# Patient Record
Sex: Male | Born: 1955 | Race: White | Hispanic: No | Marital: Married | Smoking: Former smoker
Health system: Southern US, Community
[De-identification: ages and names within clinical notes are randomized; demographics above are authoritative.]

## PROBLEM LIST (undated history)

## (undated) DIAGNOSIS — M1990 Unspecified osteoarthritis, unspecified site: Secondary | ICD-10-CM

## (undated) DIAGNOSIS — R931 Abnormal findings on diagnostic imaging of heart and coronary circulation: Secondary | ICD-10-CM

## (undated) DIAGNOSIS — E785 Hyperlipidemia, unspecified: Secondary | ICD-10-CM

## (undated) DIAGNOSIS — K5090 Crohn's disease, unspecified, without complications: Secondary | ICD-10-CM

## (undated) HISTORY — PX: TONSILLECTOMY: SUR1361

## (undated) HISTORY — DX: Unspecified osteoarthritis, unspecified site: M19.90

## (undated) HISTORY — DX: Abnormal findings on diagnostic imaging of heart and coronary circulation: R93.1

## (undated) HISTORY — DX: Crohn's disease, unspecified, without complications: K50.90

## (undated) HISTORY — DX: Hyperlipidemia, unspecified: E78.5

---

## 2015-06-20 DIAGNOSIS — L4050 Arthropathic psoriasis, unspecified: Secondary | ICD-10-CM | POA: Diagnosis not present

## 2015-10-03 DIAGNOSIS — T1512XA Foreign body in conjunctival sac, left eye, initial encounter: Secondary | ICD-10-CM | POA: Diagnosis not present

## 2015-10-15 DIAGNOSIS — Z0000 Encounter for general adult medical examination without abnormal findings: Secondary | ICD-10-CM | POA: Diagnosis not present

## 2015-10-15 DIAGNOSIS — Z125 Encounter for screening for malignant neoplasm of prostate: Secondary | ICD-10-CM | POA: Diagnosis not present

## 2015-10-22 DIAGNOSIS — K219 Gastro-esophageal reflux disease without esophagitis: Secondary | ICD-10-CM | POA: Diagnosis not present

## 2015-10-22 DIAGNOSIS — F17211 Nicotine dependence, cigarettes, in remission: Secondary | ICD-10-CM | POA: Diagnosis not present

## 2015-10-22 DIAGNOSIS — J309 Allergic rhinitis, unspecified: Secondary | ICD-10-CM | POA: Diagnosis not present

## 2015-10-22 DIAGNOSIS — Z0001 Encounter for general adult medical examination with abnormal findings: Secondary | ICD-10-CM | POA: Diagnosis not present

## 2015-10-22 DIAGNOSIS — E785 Hyperlipidemia, unspecified: Secondary | ICD-10-CM | POA: Diagnosis not present

## 2015-12-20 DIAGNOSIS — L4050 Arthropathic psoriasis, unspecified: Secondary | ICD-10-CM | POA: Diagnosis not present

## 2015-12-20 DIAGNOSIS — Z79899 Other long term (current) drug therapy: Secondary | ICD-10-CM | POA: Diagnosis not present

## 2016-04-14 DIAGNOSIS — E785 Hyperlipidemia, unspecified: Secondary | ICD-10-CM | POA: Diagnosis not present

## 2016-04-22 DIAGNOSIS — J309 Allergic rhinitis, unspecified: Secondary | ICD-10-CM | POA: Diagnosis not present

## 2016-04-22 DIAGNOSIS — F17211 Nicotine dependence, cigarettes, in remission: Secondary | ICD-10-CM | POA: Diagnosis not present

## 2016-04-22 DIAGNOSIS — E785 Hyperlipidemia, unspecified: Secondary | ICD-10-CM | POA: Diagnosis not present

## 2016-04-22 DIAGNOSIS — K219 Gastro-esophageal reflux disease without esophagitis: Secondary | ICD-10-CM | POA: Diagnosis not present

## 2016-06-19 DIAGNOSIS — Z79899 Other long term (current) drug therapy: Secondary | ICD-10-CM | POA: Diagnosis not present

## 2016-06-19 DIAGNOSIS — L4050 Arthropathic psoriasis, unspecified: Secondary | ICD-10-CM | POA: Diagnosis not present

## 2016-06-19 DIAGNOSIS — M189 Osteoarthritis of first carpometacarpal joint, unspecified: Secondary | ICD-10-CM | POA: Diagnosis not present

## 2016-10-23 DIAGNOSIS — Z79899 Other long term (current) drug therapy: Secondary | ICD-10-CM | POA: Diagnosis not present

## 2016-10-23 DIAGNOSIS — Z1321 Encounter for screening for nutritional disorder: Secondary | ICD-10-CM | POA: Diagnosis not present

## 2016-10-23 DIAGNOSIS — Z125 Encounter for screening for malignant neoplasm of prostate: Secondary | ICD-10-CM | POA: Diagnosis not present

## 2016-10-23 DIAGNOSIS — K219 Gastro-esophageal reflux disease without esophagitis: Secondary | ICD-10-CM | POA: Diagnosis not present

## 2016-10-23 DIAGNOSIS — E785 Hyperlipidemia, unspecified: Secondary | ICD-10-CM | POA: Diagnosis not present

## 2016-10-23 DIAGNOSIS — Z0001 Encounter for general adult medical examination with abnormal findings: Secondary | ICD-10-CM | POA: Diagnosis not present

## 2016-10-23 DIAGNOSIS — M7552 Bursitis of left shoulder: Secondary | ICD-10-CM | POA: Diagnosis not present

## 2016-10-23 DIAGNOSIS — L4050 Arthropathic psoriasis, unspecified: Secondary | ICD-10-CM | POA: Diagnosis not present

## 2016-10-30 DIAGNOSIS — J309 Allergic rhinitis, unspecified: Secondary | ICD-10-CM | POA: Diagnosis not present

## 2016-10-30 DIAGNOSIS — M19012 Primary osteoarthritis, left shoulder: Secondary | ICD-10-CM | POA: Diagnosis not present

## 2016-10-30 DIAGNOSIS — K219 Gastro-esophageal reflux disease without esophagitis: Secondary | ICD-10-CM | POA: Diagnosis not present

## 2016-10-30 DIAGNOSIS — M25512 Pain in left shoulder: Secondary | ICD-10-CM | POA: Diagnosis not present

## 2016-10-30 DIAGNOSIS — Z0000 Encounter for general adult medical examination without abnormal findings: Secondary | ICD-10-CM | POA: Diagnosis not present

## 2016-10-30 DIAGNOSIS — E785 Hyperlipidemia, unspecified: Secondary | ICD-10-CM | POA: Diagnosis not present

## 2017-04-30 DIAGNOSIS — M19042 Primary osteoarthritis, left hand: Secondary | ICD-10-CM | POA: Diagnosis not present

## 2017-04-30 DIAGNOSIS — M79642 Pain in left hand: Secondary | ICD-10-CM | POA: Diagnosis not present

## 2017-04-30 DIAGNOSIS — M19041 Primary osteoarthritis, right hand: Secondary | ICD-10-CM | POA: Diagnosis not present

## 2017-04-30 DIAGNOSIS — L4050 Arthropathic psoriasis, unspecified: Secondary | ICD-10-CM | POA: Diagnosis not present

## 2017-04-30 DIAGNOSIS — Z79899 Other long term (current) drug therapy: Secondary | ICD-10-CM | POA: Diagnosis not present

## 2017-10-26 DIAGNOSIS — Z125 Encounter for screening for malignant neoplasm of prostate: Secondary | ICD-10-CM | POA: Diagnosis not present

## 2017-10-26 DIAGNOSIS — Z0000 Encounter for general adult medical examination without abnormal findings: Secondary | ICD-10-CM | POA: Diagnosis not present

## 2017-11-05 DIAGNOSIS — L4050 Arthropathic psoriasis, unspecified: Secondary | ICD-10-CM | POA: Diagnosis not present

## 2017-11-05 DIAGNOSIS — K219 Gastro-esophageal reflux disease without esophagitis: Secondary | ICD-10-CM | POA: Diagnosis not present

## 2017-11-05 DIAGNOSIS — E785 Hyperlipidemia, unspecified: Secondary | ICD-10-CM | POA: Diagnosis not present

## 2017-11-05 DIAGNOSIS — Z0000 Encounter for general adult medical examination without abnormal findings: Secondary | ICD-10-CM | POA: Diagnosis not present

## 2017-12-21 DIAGNOSIS — K219 Gastro-esophageal reflux disease without esophagitis: Secondary | ICD-10-CM | POA: Diagnosis not present

## 2017-12-21 DIAGNOSIS — M1990 Unspecified osteoarthritis, unspecified site: Secondary | ICD-10-CM | POA: Diagnosis not present

## 2017-12-21 DIAGNOSIS — Z23 Encounter for immunization: Secondary | ICD-10-CM | POA: Diagnosis not present

## 2017-12-21 DIAGNOSIS — L4050 Arthropathic psoriasis, unspecified: Secondary | ICD-10-CM | POA: Diagnosis not present

## 2017-12-21 DIAGNOSIS — Z79899 Other long term (current) drug therapy: Secondary | ICD-10-CM | POA: Diagnosis not present

## 2018-09-06 DIAGNOSIS — K219 Gastro-esophageal reflux disease without esophagitis: Secondary | ICD-10-CM | POA: Diagnosis not present

## 2018-09-06 DIAGNOSIS — M1990 Unspecified osteoarthritis, unspecified site: Secondary | ICD-10-CM | POA: Diagnosis not present

## 2018-09-06 DIAGNOSIS — L4050 Arthropathic psoriasis, unspecified: Secondary | ICD-10-CM | POA: Diagnosis not present

## 2018-09-06 DIAGNOSIS — Z79899 Other long term (current) drug therapy: Secondary | ICD-10-CM | POA: Diagnosis not present

## 2018-10-18 DIAGNOSIS — E785 Hyperlipidemia, unspecified: Secondary | ICD-10-CM | POA: Diagnosis not present

## 2018-10-18 DIAGNOSIS — Z125 Encounter for screening for malignant neoplasm of prostate: Secondary | ICD-10-CM | POA: Diagnosis not present

## 2018-10-18 DIAGNOSIS — Z0000 Encounter for general adult medical examination without abnormal findings: Secondary | ICD-10-CM | POA: Diagnosis not present

## 2018-10-25 DIAGNOSIS — Z0000 Encounter for general adult medical examination without abnormal findings: Secondary | ICD-10-CM | POA: Diagnosis not present

## 2018-10-25 DIAGNOSIS — L4050 Arthropathic psoriasis, unspecified: Secondary | ICD-10-CM | POA: Diagnosis not present

## 2018-10-25 DIAGNOSIS — E782 Mixed hyperlipidemia: Secondary | ICD-10-CM | POA: Diagnosis not present

## 2018-10-25 DIAGNOSIS — K219 Gastro-esophageal reflux disease without esophagitis: Secondary | ICD-10-CM | POA: Diagnosis not present

## 2018-12-27 DIAGNOSIS — Z23 Encounter for immunization: Secondary | ICD-10-CM | POA: Diagnosis not present

## 2019-05-09 DIAGNOSIS — M1990 Unspecified osteoarthritis, unspecified site: Secondary | ICD-10-CM | POA: Diagnosis not present

## 2019-05-09 DIAGNOSIS — L4050 Arthropathic psoriasis, unspecified: Secondary | ICD-10-CM | POA: Diagnosis not present

## 2019-05-09 DIAGNOSIS — K219 Gastro-esophageal reflux disease without esophagitis: Secondary | ICD-10-CM | POA: Diagnosis not present

## 2019-05-09 DIAGNOSIS — Z79899 Other long term (current) drug therapy: Secondary | ICD-10-CM | POA: Diagnosis not present

## 2019-07-14 DIAGNOSIS — H40033 Anatomical narrow angle, bilateral: Secondary | ICD-10-CM | POA: Diagnosis not present

## 2019-07-19 DIAGNOSIS — H40033 Anatomical narrow angle, bilateral: Secondary | ICD-10-CM | POA: Diagnosis not present

## 2019-07-19 DIAGNOSIS — H40053 Ocular hypertension, bilateral: Secondary | ICD-10-CM | POA: Diagnosis not present

## 2019-07-19 DIAGNOSIS — H02834 Dermatochalasis of left upper eyelid: Secondary | ICD-10-CM | POA: Diagnosis not present

## 2019-07-19 DIAGNOSIS — H02831 Dermatochalasis of right upper eyelid: Secondary | ICD-10-CM | POA: Diagnosis not present

## 2019-08-29 DIAGNOSIS — H02834 Dermatochalasis of left upper eyelid: Secondary | ICD-10-CM | POA: Diagnosis not present

## 2019-08-29 DIAGNOSIS — H40033 Anatomical narrow angle, bilateral: Secondary | ICD-10-CM | POA: Diagnosis not present

## 2019-08-29 DIAGNOSIS — H40053 Ocular hypertension, bilateral: Secondary | ICD-10-CM | POA: Diagnosis not present

## 2019-08-29 DIAGNOSIS — H02831 Dermatochalasis of right upper eyelid: Secondary | ICD-10-CM | POA: Diagnosis not present

## 2019-10-31 DIAGNOSIS — Z125 Encounter for screening for malignant neoplasm of prostate: Secondary | ICD-10-CM | POA: Diagnosis not present

## 2019-10-31 DIAGNOSIS — Z0000 Encounter for general adult medical examination without abnormal findings: Secondary | ICD-10-CM | POA: Diagnosis not present

## 2019-10-31 DIAGNOSIS — E785 Hyperlipidemia, unspecified: Secondary | ICD-10-CM | POA: Diagnosis not present

## 2019-11-07 DIAGNOSIS — L4050 Arthropathic psoriasis, unspecified: Secondary | ICD-10-CM | POA: Diagnosis not present

## 2019-11-07 DIAGNOSIS — Z0000 Encounter for general adult medical examination without abnormal findings: Secondary | ICD-10-CM | POA: Diagnosis not present

## 2019-11-07 DIAGNOSIS — K219 Gastro-esophageal reflux disease without esophagitis: Secondary | ICD-10-CM | POA: Diagnosis not present

## 2019-11-07 DIAGNOSIS — J309 Allergic rhinitis, unspecified: Secondary | ICD-10-CM | POA: Diagnosis not present

## 2019-11-30 DIAGNOSIS — Z1211 Encounter for screening for malignant neoplasm of colon: Secondary | ICD-10-CM | POA: Diagnosis not present

## 2019-12-27 DIAGNOSIS — K6389 Other specified diseases of intestine: Secondary | ICD-10-CM | POA: Diagnosis not present

## 2019-12-27 DIAGNOSIS — Z1211 Encounter for screening for malignant neoplasm of colon: Secondary | ICD-10-CM | POA: Diagnosis not present

## 2019-12-27 DIAGNOSIS — K529 Noninfective gastroenteritis and colitis, unspecified: Secondary | ICD-10-CM | POA: Diagnosis not present

## 2020-01-24 DIAGNOSIS — M1990 Unspecified osteoarthritis, unspecified site: Secondary | ICD-10-CM | POA: Diagnosis not present

## 2020-01-24 DIAGNOSIS — K219 Gastro-esophageal reflux disease without esophagitis: Secondary | ICD-10-CM | POA: Diagnosis not present

## 2020-01-24 DIAGNOSIS — Z79899 Other long term (current) drug therapy: Secondary | ICD-10-CM | POA: Diagnosis not present

## 2020-01-24 DIAGNOSIS — Z23 Encounter for immunization: Secondary | ICD-10-CM | POA: Diagnosis not present

## 2020-01-24 DIAGNOSIS — L4050 Arthropathic psoriasis, unspecified: Secondary | ICD-10-CM | POA: Diagnosis not present

## 2020-02-29 DIAGNOSIS — K5090 Crohn's disease, unspecified, without complications: Secondary | ICD-10-CM | POA: Diagnosis not present

## 2020-02-29 DIAGNOSIS — R197 Diarrhea, unspecified: Secondary | ICD-10-CM | POA: Diagnosis not present

## 2020-06-20 DIAGNOSIS — K5090 Crohn's disease, unspecified, without complications: Secondary | ICD-10-CM | POA: Diagnosis not present

## 2020-06-20 DIAGNOSIS — R197 Diarrhea, unspecified: Secondary | ICD-10-CM | POA: Diagnosis not present

## 2020-07-18 DIAGNOSIS — R197 Diarrhea, unspecified: Secondary | ICD-10-CM | POA: Diagnosis not present

## 2020-07-18 DIAGNOSIS — K5090 Crohn's disease, unspecified, without complications: Secondary | ICD-10-CM | POA: Diagnosis not present

## 2020-07-23 DIAGNOSIS — L4050 Arthropathic psoriasis, unspecified: Secondary | ICD-10-CM | POA: Diagnosis not present

## 2020-07-23 DIAGNOSIS — M1812 Unilateral primary osteoarthritis of first carpometacarpal joint, left hand: Secondary | ICD-10-CM | POA: Diagnosis not present

## 2020-07-23 DIAGNOSIS — Z79899 Other long term (current) drug therapy: Secondary | ICD-10-CM | POA: Diagnosis not present

## 2020-07-23 DIAGNOSIS — M1811 Unilateral primary osteoarthritis of first carpometacarpal joint, right hand: Secondary | ICD-10-CM | POA: Diagnosis not present

## 2020-07-23 DIAGNOSIS — K219 Gastro-esophageal reflux disease without esophagitis: Secondary | ICD-10-CM | POA: Diagnosis not present

## 2020-07-23 DIAGNOSIS — M19042 Primary osteoarthritis, left hand: Secondary | ICD-10-CM | POA: Diagnosis not present

## 2020-07-23 DIAGNOSIS — M19041 Primary osteoarthritis, right hand: Secondary | ICD-10-CM | POA: Diagnosis not present

## 2020-07-23 DIAGNOSIS — M1990 Unspecified osteoarthritis, unspecified site: Secondary | ICD-10-CM | POA: Diagnosis not present

## 2020-07-23 DIAGNOSIS — M79641 Pain in right hand: Secondary | ICD-10-CM | POA: Diagnosis not present

## 2020-09-12 DIAGNOSIS — K5090 Crohn's disease, unspecified, without complications: Secondary | ICD-10-CM | POA: Diagnosis not present

## 2020-09-12 DIAGNOSIS — R197 Diarrhea, unspecified: Secondary | ICD-10-CM | POA: Diagnosis not present

## 2020-10-29 DIAGNOSIS — Z0000 Encounter for general adult medical examination without abnormal findings: Secondary | ICD-10-CM | POA: Diagnosis not present

## 2020-10-29 DIAGNOSIS — Z125 Encounter for screening for malignant neoplasm of prostate: Secondary | ICD-10-CM | POA: Diagnosis not present

## 2020-10-31 DIAGNOSIS — L4052 Psoriatic arthritis mutilans: Secondary | ICD-10-CM | POA: Diagnosis not present

## 2020-10-31 DIAGNOSIS — K5090 Crohn's disease, unspecified, without complications: Secondary | ICD-10-CM | POA: Diagnosis not present

## 2020-11-05 DIAGNOSIS — Z0000 Encounter for general adult medical examination without abnormal findings: Secondary | ICD-10-CM | POA: Diagnosis not present

## 2020-11-06 DIAGNOSIS — K6389 Other specified diseases of intestine: Secondary | ICD-10-CM | POA: Diagnosis not present

## 2020-11-06 DIAGNOSIS — K5090 Crohn's disease, unspecified, without complications: Secondary | ICD-10-CM | POA: Diagnosis not present

## 2020-11-06 DIAGNOSIS — R197 Diarrhea, unspecified: Secondary | ICD-10-CM | POA: Diagnosis not present

## 2020-11-06 DIAGNOSIS — K529 Noninfective gastroenteritis and colitis, unspecified: Secondary | ICD-10-CM | POA: Diagnosis not present

## 2020-11-07 ENCOUNTER — Other Ambulatory Visit: Payer: Self-pay | Admitting: Internal Medicine

## 2020-11-07 DIAGNOSIS — E782 Mixed hyperlipidemia: Secondary | ICD-10-CM

## 2020-12-03 ENCOUNTER — Other Ambulatory Visit: Payer: Self-pay

## 2020-12-14 ENCOUNTER — Ambulatory Visit
Admission: RE | Admit: 2020-12-14 | Discharge: 2020-12-14 | Disposition: A | Discharge status: Home / Self Care | Payer: No Typology Code available for payment source | Source: Ambulatory Visit | Attending: Internal Medicine | Admitting: Internal Medicine

## 2020-12-14 DIAGNOSIS — E782 Mixed hyperlipidemia: Secondary | ICD-10-CM

## 2020-12-26 DIAGNOSIS — H401131 Primary open-angle glaucoma, bilateral, mild stage: Secondary | ICD-10-CM | POA: Diagnosis not present

## 2020-12-29 HISTORY — PX: COLONOSCOPY: SHX174

## 2021-01-16 DIAGNOSIS — M1990 Unspecified osteoarthritis, unspecified site: Secondary | ICD-10-CM | POA: Diagnosis not present

## 2021-01-16 DIAGNOSIS — L4050 Arthropathic psoriasis, unspecified: Secondary | ICD-10-CM | POA: Diagnosis not present

## 2021-01-16 DIAGNOSIS — M79641 Pain in right hand: Secondary | ICD-10-CM | POA: Diagnosis not present

## 2021-01-16 DIAGNOSIS — Z23 Encounter for immunization: Secondary | ICD-10-CM | POA: Diagnosis not present

## 2021-01-16 DIAGNOSIS — Z79899 Other long term (current) drug therapy: Secondary | ICD-10-CM | POA: Diagnosis not present

## 2021-01-29 ENCOUNTER — Ambulatory Visit: Payer: BC Managed Care – PPO | Admitting: Cardiology

## 2021-01-29 ENCOUNTER — Other Ambulatory Visit: Payer: Self-pay

## 2021-01-29 ENCOUNTER — Encounter: Payer: Self-pay | Admitting: Cardiology

## 2021-01-29 VITALS — BP 141/82 | HR 76 | Temp 97.4°F | Ht 71.0 in | Wt 213.0 lb

## 2021-01-29 DIAGNOSIS — E782 Mixed hyperlipidemia: Secondary | ICD-10-CM

## 2021-01-29 DIAGNOSIS — Z87891 Personal history of nicotine dependence: Secondary | ICD-10-CM

## 2021-01-29 DIAGNOSIS — R931 Abnormal findings on diagnostic imaging of heart and coronary circulation: Secondary | ICD-10-CM | POA: Diagnosis not present

## 2021-01-29 DIAGNOSIS — R072 Precordial pain: Secondary | ICD-10-CM | POA: Diagnosis not present

## 2021-01-29 DIAGNOSIS — I77810 Thoracic aortic ectasia: Secondary | ICD-10-CM | POA: Diagnosis not present

## 2021-01-29 MED ORDER — ASPIRIN EC 81 MG PO TBEC: 81.0000 mg | DELAYED_RELEASE_TABLET | Freq: Every day | ORAL | 11 refills | Status: DC

## 2021-01-29 NOTE — Progress Notes (Signed)
Date:  01/29/2021   ID:  Cameron Simmons, DOB 09/06/1955, MRN [id number]  PCP:  Georgianne Fick, MD  Cardiologist:  Tessa Lerner, DO, [facility] (established care 01/29/2021)  REASON FOR CONSULT: Evaluation for coronary artery disease and dilated aorta  REQUESTING PHYSICIAN:  Georgianne Fick, MD [address] [place],  [place] [address]  Chief Complaint  Patient presents with   Chest Pain   Establish Care    Coronary calcium.    HPI  Cameron Simmons is a 65 y.o. Caucasian male who presents to the office with a chief complaint of "coronary artery calcification." Patient's past medical history and cardiovascular risk factors include: Psoriatic arthritis, Crohn's disease, moderate coronary artery calcification, hyperlipidemia.  He is referred to the office at the request of Georgianne Fick, MD for evaluation of Evaluation for coronary artery disease and dilated aorta.  Patient was recommended to undergo a coronary artery calcium score for risk stratification for CAD and was noted to have moderate CAC with a total CAC of 105 AU placing him at the 58th percentile and mild dilated ascending aorta.  He is now referred to cardiology for further evaluation and management.  He denies any active chest pain or shortness of breath at rest or with effort related activities. But infrequently gets precordial pain.  Located midsternal, intensity 5 out of 10, duration 30 seconds, self-limited, not brought on by effort related activities and does not resolve with rest.  Patient himself also notes that these are atypical symptoms and may be associated with heartburn as well.  No change in overall physical exertion.  Recent coronary calcium score also noted mildly dilated ascending aorta at 41 mm.  No family history of aortic syndromes.  Patient states that he has family history of benign essential hypertension but he has not been diagnosed officially.  He is not on any  antihypertensive medications. Eats out 4x per week.   FUNCTIONAL STATUS: No structured exercise program; however, patient's remains active with day-to-day activities, yard work, and leisure he goes out for a walk in his community.  ALLERGIES: No Known Allergies  MEDICATION LIST PRIOR TO VISIT: Current Meds  Medication Sig   acetaminophen (TYLENOL) 500 MG tablet Take 500 mg by mouth every 6 (six) hours as needed.   aspirin EC 81 MG tablet Take 1 tablet (81 mg total) by mouth daily. Swallow whole.   latanoprost (XALATAN) 0.005 % ophthalmic solution Place 1 drop into both eyes at bedtime.   lovastatin (MEVACOR) 20 MG tablet Take 20 mg by mouth at bedtime.     PAST MEDICAL HISTORY: Past Medical History:  Diagnosis Date   Agatston coronary artery calcium score between 100 and 199    Arthritis    Crohn's disease (HCC)    Hyperlipidemia     PAST SURGICAL HISTORY: Past Surgical History:  Procedure Laterality Date   COLONOSCOPY  12/29/2020   TONSILLECTOMY     when pt was 6    FAMILY HISTORY: The patient family history includes Aneurysm in his mother; Colon cancer in his mother; Heart attack in his father, maternal grandfather, maternal grandmother, maternal uncle, and paternal grandfather; Hyperlipidemia in his brother, father, maternal uncle, and paternal grandfather; Hypertension in his brother and father; Lung cancer in his mother; Prostate cancer in his father.  SOCIAL HISTORY:  The patient  reports that he quit smoking about 42 years ago. His smoking use included cigarettes. He has a 8.00 pack-year smoking history. He has never used smokeless tobacco.  He reports current alcohol use.  REVIEW OF SYSTEMS: Review of Systems  Constitutional: Negative for chills and fever.  HENT:  Negative for hoarse voice and nosebleeds.   Eyes:  Negative for discharge, double vision and pain.  Cardiovascular:  Positive for chest pain. Negative for claudication, dyspnea on exertion, leg swelling,  near-syncope, orthopnea, palpitations, paroxysmal nocturnal dyspnea and syncope.  Respiratory:  Negative for hemoptysis and shortness of breath.   Musculoskeletal:  Positive for arthritis and muscle cramps. Negative for myalgias.  Gastrointestinal:  Negative for abdominal pain, constipation, diarrhea, hematemesis, hematochezia, melena, nausea and vomiting.  Neurological:  Negative for dizziness and light-headedness.   PHYSICAL EXAM: Vitals with BMI 01/29/2021  Height 5\' 11"   Weight 213 lbs  BMI 29.72  Systolic 141  Diastolic 82  Pulse 76    CONSTITUTIONAL: Well-developed and well-nourished. No acute distress.  SKIN: Skin is warm and dry. No rash noted. No cyanosis. No pallor. No jaundice HEAD: Normocephalic and atraumatic.  EYES: No scleral icterus MOUTH/THROAT: Moist oral membranes.  NECK: No JVD present. No thyromegaly noted. No carotid bruits  LYMPHATIC: No visible cervical adenopathy.  CHEST Normal respiratory effort. No intercostal retractions  LUNGS: Clear to auscultation bilaterally.  No stridor. No wheezes. No rales.  CARDIOVASCULAR: Regular rate and rhythm, positive S1-S2, no murmurs rubs or gallops appreciated. ABDOMINAL: Obese, soft, nontender, nondistended, positive bowel sounds all 4 quadrants. No apparent ascites.  EXTREMITIES: No peripheral edema, warm to touch, Heberden's nodes.  HEMATOLOGIC: No significant bruising NEUROLOGIC: Oriented to person, place, and time. Nonfocal. Normal muscle tone.  PSYCHIATRIC: Normal mood and affect. Normal behavior. Cooperative  CARDIAC DATABASE: EKG: 01/29/2021: NSR, 70bpm, IRBBB, without underlying injury pattern.   Echocardiogram: No results found for this or any previous visit from the past 1095 days.    Stress Testing: No results found for this or any previous visit from the past 1095 days.   Heart Catheterization: None  Coronary artery calcium score: 12/14/2020: Left Main: 10   LAD: 95   LCx: 0   RCA: 0    Total Agatston Score: 105   MESA database percentile: 58   AORTA MEASUREMENTS:   Ascending Aorta: 41 mm   Descending Aorta: 27 mm 1. Coronary calcium score of 105 is at the 58th percentile for the patient's age, sex and race. 2. 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 Recommend annual imaging followup by CTA or MRA. This recommendation follows 2010 ACCF/AHA/AATS/ACR/ASA/SCA/SCAI/SIR/STS/SVM Guidelines for the Diagnosis and Management of Patients with Thoracic Aortic Disease. Circulation. 2010; 1212011. Aortic aneurysm NOS (ICD10-I71.9) 3. Evidence of prior granulomatous disease with calcified right hilar lymph nodes.  LABORATORY DATA: No flowsheet data found.  No flowsheet data found.  Lipid Panel  No results found for: CHOL, TRIG, HDL, CHOLHDL, VLDL, LDLCALC, LDLDIRECT, LABVLDL  No components found for: NTPROBNP No results for input(s): PROBNP in the last 8760 hours. No results for input(s): TSH in the last 8760 hours.  BMP No results for input(s): NA, K, CL, CO2, GLUCOSE, BUN, CREATININE, CALCIUM, GFRNONAA, GFRAA in the last 8760 hours.  HEMOGLOBIN A1C No results found for: HGBA1C, MPG  IMPRESSION:    ICD-10-CM   1. Precordial pain  R07.2 PCV ECHOCARDIOGRAM COMPLETE    PCV MYOCARDIAL PERFUSION WO LEXISCAN    2. Agatston coronary artery calcium score between 100 and 199  R93.1 EKG 12-Lead    PCV ECHOCARDIOGRAM COMPLETE    PCV MYOCARDIAL PERFUSION WO LEXISCAN  aspirin EC 81 MG tablet    3. Mixed hyperlipidemia  E78.2 PCV ECHOCARDIOGRAM COMPLETE    PCV MYOCARDIAL PERFUSION WO LEXISCAN    4. Ascending aorta dilatation (HCC)  I77.810     5. Former smoker  Z87.891        RECOMMENDATIONS: Cameron Simmons is a 65 y.o. Caucasian male whose past medical history and cardiac risk factors include: Psoriatic arthritis, Crohn's disease, moderate coronary artery calcification, hyperlipidemia.  Precordial  pain Noncardiac. However, given his CAC, risk factors, precordial discomfort is shared decision was to proceed with ischemic evaluation. Echocardiogram will be ordered to evaluate for structural heart disease and left ventricular systolic function. Plan exercise nuclear stress test to evaluate for functional status and reversible ischemia  Agatston coronary artery calcium score between 100 and 199 Total CAC 105, 58th percentile Currently on statin therapy I have asked him to discuss with his GI provider to see if aspirin 81 mg p.o. daily could be considered in the setting of his underlying Crohn's.  If the risks outweigh the benefit we will hold off on baby aspirin for now.  Mixed hyperlipidemia Currently on statin therapy. Currently managed by primary care provider. I have asked him to bring in his labs for review at the next office visit.  Ascending aorta dilatation (HCC) Incidentally noted to have ascending aorta measuring 41 mm on his recent coronary calcium study. Will reevaluate the dimensions on surface echocardiogram. As long as the dimensions are correlating it may be beneficial to follow-up with an echocardiogram on a yearly basis as recommended by radiology literature. Patient's blood pressures are not well controlled at today's visit.  I have asked him to keep a log of his blood pressures and to either review it with myself or his PCP.  Clinically I suspect that he has untreated hypertension which may be contributing to his aortic findings.  Former smoker Educated on the importance of continued smoking cessation.   FINAL MEDICATION LIST END OF ENCOUNTER: Meds ordered this encounter  Medications   aspirin EC 81 MG tablet    Sig: Take 1 tablet (81 mg total) by mouth daily. Swallow whole.    Dispense:  30 tablet    Refill:  11     There are no discontinued medications.   Current Outpatient Medications:    acetaminophen (TYLENOL) 500 MG tablet, Take 500 mg by mouth every  6 (six) hours as needed., Disp: , Rfl:    aspirin EC 81 MG tablet, Take 1 tablet (81 mg total) by mouth daily. Swallow whole., Disp: 30 tablet, Rfl: 11   latanoprost (XALATAN) 0.005 % ophthalmic solution, Place 1 drop into both eyes at bedtime., Disp: , Rfl:    lovastatin (MEVACOR) 20 MG tablet, Take 20 mg by mouth at bedtime., Disp: , Rfl:    HUMIRA PEN 40 MG/0.4ML PNKT, SMARTSIG:40 Milligram(s) SUB-Q Once a Week, Disp: , Rfl:    predniSONE (DELTASONE) 10 MG tablet, Take 40 mg by mouth daily., Disp: , Rfl:   Orders Placed This Encounter  Procedures   PCV MYOCARDIAL PERFUSION WO LEXISCAN   EKG 12-Lead   PCV ECHOCARDIOGRAM COMPLETE    There are no Patient Instructions on file for this visit.   --Continue cardiac medications as reconciled in final medication list. --Return in about 7 weeks (around 03/19/2021) for Follow up, Coronary artery calcification. Or sooner if needed. --Continue follow-up with your primary care physician regarding the management of your other chronic comorbid conditions.  Patient's questions and concerns  were addressed to his satisfaction. He voices understanding of the instructions provided during this encounter.   This note was created using a voice recognition software as a result there may be grammatical errors inadvertently enclosed that do not reflect the nature of this encounter. Every attempt is made to correct such errors.  Tessa Lerner, [place], [facility]  Pager: [phone] Office: [phone]

## 2021-02-13 ENCOUNTER — Other Ambulatory Visit: Payer: Self-pay

## 2021-02-13 ENCOUNTER — Ambulatory Visit: Payer: BC Managed Care – PPO

## 2021-02-13 DIAGNOSIS — R072 Precordial pain: Secondary | ICD-10-CM

## 2021-02-13 DIAGNOSIS — R931 Abnormal findings on diagnostic imaging of heart and coronary circulation: Secondary | ICD-10-CM

## 2021-02-13 DIAGNOSIS — E782 Mixed hyperlipidemia: Secondary | ICD-10-CM

## 2021-03-25 DIAGNOSIS — H401131 Primary open-angle glaucoma, bilateral, mild stage: Secondary | ICD-10-CM | POA: Diagnosis not present

## 2021-03-26 ENCOUNTER — Encounter: Payer: Self-pay | Admitting: Cardiology

## 2021-03-26 ENCOUNTER — Other Ambulatory Visit: Payer: Self-pay

## 2021-03-26 ENCOUNTER — Ambulatory Visit: Payer: BC Managed Care – PPO | Admitting: Cardiology

## 2021-03-26 VITALS — BP 121/76 | HR 70 | Temp 97.6°F | Resp 16 | Ht 71.0 in | Wt 218.0 lb

## 2021-03-26 DIAGNOSIS — I77810 Thoracic aortic ectasia: Secondary | ICD-10-CM | POA: Diagnosis not present

## 2021-03-26 DIAGNOSIS — Z87891 Personal history of nicotine dependence: Secondary | ICD-10-CM

## 2021-03-26 DIAGNOSIS — R931 Abnormal findings on diagnostic imaging of heart and coronary circulation: Secondary | ICD-10-CM | POA: Diagnosis not present

## 2021-03-26 DIAGNOSIS — E782 Mixed hyperlipidemia: Secondary | ICD-10-CM | POA: Diagnosis not present

## 2021-03-26 NOTE — Progress Notes (Signed)
Date:  03/26/2021   ID:  Lana Fish, DOB Apr 19, 1955, MRN [id number]  PCP:  Georgianne Fick, MD  Cardiologist:  Tessa Lerner, DO, [facility] (established care 01/29/2021)  Date: 03/26/21 Last Office Visit: 01/29/2021  Chief Complaint  Patient presents with    Coronary artery calcification   Follow-up    HPI  Cameron Simmons is a 66 y.o. Caucasian male who presents to the office with a chief complaint of "Work-up for coronary artery calcification, discuss test results." Patient's past medical history and cardiovascular risk factors include: Psoriatic arthritis, Crohn's disease, moderate coronary artery calcification, hyperlipidemia.  He is referred to the office at the request of Georgianne Fick, MD for evaluation of Evaluation for coronary artery disease and dilated aorta.  Given his coronary artery calcification, precordial discomfort, hyperlipidemia the shared decision was to proceed with echocardiogram and stress test at the last office visit.  Results of the echo and stress test discussed with him in great detail and noted below for further reference.  Since last office visit his chest pain has resolved without reoccurrence.  He was noted to have elevated blood pressures at the last office visit and given his coronary calcium score noting dilatation of the aorta I have asked him to keep a log of his blood pressures to see if medications are warranted.  Patient brings in a blood pressure log and based on visual estimation SBP ranges between 104-122 and diastolic blood pressures range between 66-76 mmHg.  Given his Crohn's disease he did call his gastroenterologist to see if aspirin 81 mg was okay.  He was cleared by GI and is tolerating aspirin 81 mg without any side effects or intolerances.  Since last office visit he remains relatively stable without any new onset of anginal discomfort or heart failure symptoms.  FUNCTIONAL STATUS: No structured exercise program;  however, patient's remains active with day-to-day activities, yard work, and leisure he goes out for a walk in his community.  ALLERGIES: No Known Allergies  MEDICATION LIST PRIOR TO VISIT: Current Meds  Medication Sig   acetaminophen (TYLENOL) 500 MG tablet Take 500 mg by mouth every 6 (six) hours as needed.   aspirin EC 81 MG tablet Take 1 tablet (81 mg total) by mouth daily. Swallow whole.   HUMIRA PEN 40 MG/0.4ML PNKT SMARTSIG:40 Milligram(s) SUB-Q Once a Week   latanoprost (XALATAN) 0.005 % ophthalmic solution Place 1 drop into both eyes at bedtime.   lovastatin (MEVACOR) 20 MG tablet Take 20 mg by mouth at bedtime.     PAST MEDICAL HISTORY: Past Medical History:  Diagnosis Date   Agatston coronary artery calcium score between 100 and 199    Arthritis    Crohn's disease (HCC)    Hyperlipidemia     PAST SURGICAL HISTORY: Past Surgical History:  Procedure Laterality Date   COLONOSCOPY  12/29/2020   TONSILLECTOMY     when pt was 6    FAMILY HISTORY: The patient family history includes Aneurysm in his mother; Colon cancer in his mother; Heart attack in his father, maternal grandfather, maternal grandmother, maternal uncle, and paternal grandfather; Hyperlipidemia in his brother, father, maternal uncle, and paternal grandfather; Hypertension in his brother and father; Lung cancer in his mother; Prostate cancer in his father.  SOCIAL HISTORY:  The patient  reports that he quit smoking about 43 years ago. His smoking use included cigarettes. He has a 8.00 pack-year smoking history. He has never used smokeless tobacco. He reports current alcohol use.  REVIEW OF SYSTEMS: Review of Systems  Constitutional: Negative for chills and fever.  HENT:  Negative for hoarse voice and nosebleeds.   Eyes:  Negative for discharge, double vision and pain.  Cardiovascular:  Negative for chest pain, claudication, dyspnea on exertion, leg swelling, near-syncope, orthopnea, palpitations,  paroxysmal nocturnal dyspnea and syncope.  Respiratory:  Negative for hemoptysis and shortness of breath.   Musculoskeletal:  Positive for arthritis and muscle cramps. Negative for myalgias.  Gastrointestinal:  Negative for abdominal pain, constipation, diarrhea, hematemesis, hematochezia, melena, nausea and vomiting.  Neurological:  Negative for dizziness and light-headedness.   PHYSICAL EXAM: Vitals with BMI 03/26/2021 01/29/2021  Height 5\' 11"  5\' 11"   Weight 218 lbs 213 lbs  BMI [id number]  Systolic 123XX123 [id number]  Diastolic 76 82  Pulse 70 76    CONSTITUTIONAL: Well-developed and well-nourished. No acute distress.  SKIN: Skin is warm and dry. No rash noted. No cyanosis. No pallor. No jaundice HEAD: Normocephalic and atraumatic.  EYES: No scleral icterus MOUTH/THROAT: Moist oral membranes.  NECK: No JVD present. No thyromegaly noted. No carotid bruits  LYMPHATIC: No visible cervical adenopathy.  CHEST Normal respiratory effort. No intercostal retractions  LUNGS: Clear to auscultation bilaterally.  No stridor. No wheezes. No rales.  CARDIOVASCULAR: Regular rate and rhythm, positive S1-S2, no murmurs rubs or gallops appreciated. ABDOMINAL: Obese, soft, nontender, nondistended, positive bowel sounds all 4 quadrants. No apparent ascites.  EXTREMITIES: No peripheral edema, warm to touch, Heberden's nodes.  HEMATOLOGIC: No significant bruising NEUROLOGIC: Oriented to person, place, and time. Nonfocal. Normal muscle tone.  PSYCHIATRIC: Normal mood and affect. Normal behavior. Cooperative  CARDIAC DATABASE: EKG: 01/29/2021: NSR, 70bpm, IRBBB, without underlying injury pattern.   Echocardiogram: 02/13/2021: Normal LV systolic function with visual EF 60-65%. Left ventricle cavity is normal in size. Normal left ventricular wall thickness. Normal global wall motion. Normal diastolic filling pattern, normal LAP.  Mild (Grade I) mitral regurgitation. The aortic root is mildly dilated, 28mm.  Proximal ascending aorta not well visualized. No prior study for comparison.   Stress Testing: Exercise Myoview stress test 02/13/2021: 1 Day Rest/Stress Protocol. Exercise time 8 minutes 20 seconds on Bruce protocol, achieved 10.16 METS, 90% of APMHR. Stress ECG negative for ischemia.  Normal myocardial perfusion without underlying ischemia or injury pattern. Normal left ventricular size, wall thickness preserved, without regional wall motion abnormalities. Calculated LVEF 59%.  Heart Catheterization: None  Coronary artery calcium score: 12/14/2020: Left Main: 10   LAD: 95   LCx: 0   RCA: 0   Total Agatston Score: 105   MESA database percentile: 58   AORTA MEASUREMENTS:   Ascending Aorta: 41 mm   Descending Aorta: 27 mm 1. Coronary calcium score of 105 is at the 58th percentile for the patient's age, sex and race. 2. 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 Recommend annual imaging followup by CTA or MRA. This recommendation follows 2010 ACCF/AHA/AATS/ACR/ASA/SCA/SCAI/SIR/STS/SVM Guidelines for the Diagnosis and Management of Patients with Thoracic Aortic Disease. Circulation. 2010; 121ML:4928372. Aortic aneurysm NOS (ICD10-I71.9) 3. Evidence of prior granulomatous disease with calcified right hilar lymph nodes.  LABORATORY DATA: No flowsheet data found.  No flowsheet data found.  Lipid Panel  No results found for: CHOL, TRIG, HDL, CHOLHDL, VLDL, LDLCALC, LDLDIRECT, LABVLDL  No components found for: NTPROBNP No results for input(s): PROBNP in the last 8760 hours. No results for input(s): TSH in the last 8760 hours.  BMP No results for input(s): NA,  K, CL, CO2, GLUCOSE, BUN, CREATININE, CALCIUM, GFRNONAA, GFRAA in the last 8760 hours.  HEMOGLOBIN A1C No results found for: HGBA1C, MPG  IMPRESSION:    ICD-10-CM   1. Agatston coronary artery calcium score between 100 and 199  R93.1     2. Ascending aorta  dilatation (HCC)  I77.810 PCV ECHOCARDIOGRAM COMPLETE    3. Mixed hyperlipidemia  E78.2     4. Former smoker  Z87.891        RECOMMENDATIONS: BREYLEN ANTILLA is a 65 y.o. Caucasian male whose past medical history and cardiac risk factors include: Psoriatic arthritis, Crohn's disease, moderate coronary artery calcification, hyperlipidemia.  Agatston coronary artery calcium score between 100 and 199 Total CAC 105, 58th percentile Currently on statin therapy Has started aspirin 81 mg p.o. daily after being cleared by GI given his history of Crohn's disease. Reviewed the results of the echocardiogram and stress test with him in great detail and noted above for further reference. Clinically he no longer is experiencing chest pain. No additional cardiovascular testing warranted at this time. Educated on importance of secondary prevention and improving his modifiable cardiovascular risk factors. Strongly encourage increasing physical activity to a goal of 30 minutes a day 5 days a week as tolerated.  Mixed hyperlipidemia Currently on statin therapy. Currently managed by primary care provider.  Ascending aorta dilatation (HCC) Incidentally noted to have ascending aorta measuring 41 mm on his recent coronary calcium study. Echo notes the root to be approximately 40 mm.  Proximal ascending aorta not well visualized. We will repeat echocardiogram in 1 year to reevaluate LVEF and aortic dimensions as opposed to CT scan.   Home blood pressure logs reviewed-blood pressure is within acceptable range. I would still recommend periodically checking his blood pressures and given the dilatation of the aorta.  Patient is made aware that if his systolic blood pressures are consistently greater than 120-130 mmHg to call the office for medication titration.  Former smoker Educated on the importance of continued smoking cessation.  FINAL MEDICATION LIST END OF ENCOUNTER: No orders of the defined types  were placed in this encounter.    Medications Discontinued During This Encounter  Medication Reason   predniSONE (DELTASONE) 10 MG tablet      Current Outpatient Medications:    acetaminophen (TYLENOL) 500 MG tablet, Take 500 mg by mouth every 6 (six) hours as needed., Disp: , Rfl:    aspirin EC 81 MG tablet, Take 1 tablet (81 mg total) by mouth daily. Swallow whole., Disp: 30 tablet, Rfl: 11   HUMIRA PEN 40 MG/0.4ML PNKT, SMARTSIG:40 Milligram(s) SUB-Q Once a Week, Disp: , Rfl:    latanoprost (XALATAN) 0.005 % ophthalmic solution, Place 1 drop into both eyes at bedtime., Disp: , Rfl:    lovastatin (MEVACOR) 20 MG tablet, Take 20 mg by mouth at bedtime., Disp: , Rfl:   Orders Placed This Encounter  Procedures   PCV ECHOCARDIOGRAM COMPLETE    There are no Patient Instructions on file for this visit.   --Continue cardiac medications as reconciled in final medication list. --Return in about 1 year (around 03/26/2022) for Follow up aortic dilatation. Or sooner if needed. --Continue follow-up with your primary care physician regarding the management of your other chronic comorbid conditions.  Patient's questions and concerns were addressed to his satisfaction. He voices understanding of the instructions provided during this encounter.   This note was created using a voice recognition software as a result there may be grammatical errors inadvertently enclosed  that do not reflect the nature of this encounter. Every attempt is made to correct such errors.  Total time spent: 30 minutes.  Cameron Simmons, [place], [facility]  Pager: [phone] Office: [phone]

## 2021-04-17 DIAGNOSIS — K5090 Crohn's disease, unspecified, without complications: Secondary | ICD-10-CM | POA: Diagnosis not present

## 2021-04-17 DIAGNOSIS — L4052 Psoriatic arthritis mutilans: Secondary | ICD-10-CM | POA: Diagnosis not present

## 2021-05-08 DIAGNOSIS — E782 Mixed hyperlipidemia: Secondary | ICD-10-CM | POA: Diagnosis not present

## 2021-05-08 DIAGNOSIS — R7303 Prediabetes: Secondary | ICD-10-CM | POA: Diagnosis not present

## 2021-05-08 DIAGNOSIS — L4050 Arthropathic psoriasis, unspecified: Secondary | ICD-10-CM | POA: Diagnosis not present

## 2021-05-16 DIAGNOSIS — E782 Mixed hyperlipidemia: Secondary | ICD-10-CM | POA: Diagnosis not present

## 2021-05-16 DIAGNOSIS — R7303 Prediabetes: Secondary | ICD-10-CM | POA: Diagnosis not present

## 2021-05-16 DIAGNOSIS — I2510 Atherosclerotic heart disease of native coronary artery without angina pectoris: Secondary | ICD-10-CM | POA: Diagnosis not present

## 2021-05-16 DIAGNOSIS — K50919 Crohn's disease, unspecified, with unspecified complications: Secondary | ICD-10-CM | POA: Diagnosis not present

## 2021-05-30 NOTE — Progress Notes (Signed)
External Labs: ?Collected: 05/09/2021. ?A1c 5.7. ?Total cholesterol 146, triglycerides 112, HDL 49, LDL 75, non-HDL 97 ?Hemoglobin 13.8 g/dL, hematocrit 41.2%  ?AST 24, ALT 26, alkaline phosphatase 69 ?BUN 16, creatinine 0.98 ?Sodium 140, potassium 4.6, chloride 104, bicarb 27 ?eGFR 89 mL/min per 1.73 m? ?

## 2021-06-19 DIAGNOSIS — H401131 Primary open-angle glaucoma, bilateral, mild stage: Secondary | ICD-10-CM | POA: Diagnosis not present

## 2021-07-16 DIAGNOSIS — M6281 Muscle weakness (generalized): Secondary | ICD-10-CM | POA: Diagnosis not present

## 2021-07-16 DIAGNOSIS — Z79899 Other long term (current) drug therapy: Secondary | ICD-10-CM | POA: Diagnosis not present

## 2021-07-16 DIAGNOSIS — L4050 Arthropathic psoriasis, unspecified: Secondary | ICD-10-CM | POA: Diagnosis not present

## 2021-07-16 DIAGNOSIS — M1990 Unspecified osteoarthritis, unspecified site: Secondary | ICD-10-CM | POA: Diagnosis not present

## 2021-10-02 DIAGNOSIS — H401131 Primary open-angle glaucoma, bilateral, mild stage: Secondary | ICD-10-CM | POA: Diagnosis not present

## 2021-10-12 IMAGING — CT CT CARDIAC CORONARY ARTERY CALCIUM SCORE
3 series · 14 of 20 positions shown, 16 images · non-contrast
Comparison: None.

CLINICAL DATA: 64-year-old Caucasian male with history of
hyperlipidemia, family history of heart disease and prior smoking
history.

EXAM:
CT CARDIAC CORONARY ARTERY CALCIUM SCORE
TECHNIQUE: Non-contrast imaging through the heart was performed using
prospective ECG gating. Image post processing was performed on an
independent workstation, allowing for quantitative analysis of the
heart and coronary arteries. Note that this exam targets the heart
and the chest was not imaged in its entirety.

[Series 2: calcium scoring 2.00 qr36 bestdiast 70% hrt calciu · axial · 0.39mm/px · z∈[+1903,+1987]mm · 4 of 70 slices shown]
[im 14/70  vessel]
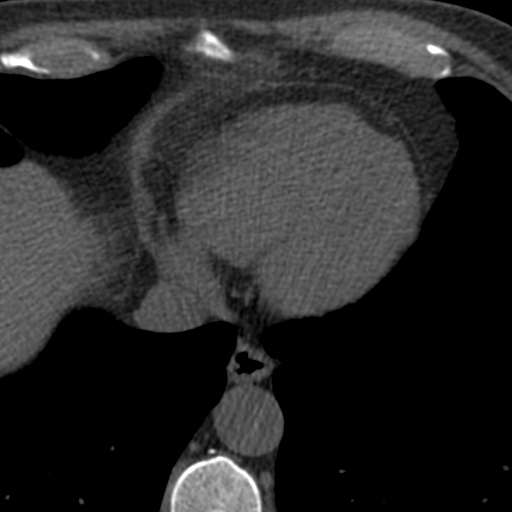
[im 28/70  vessel]
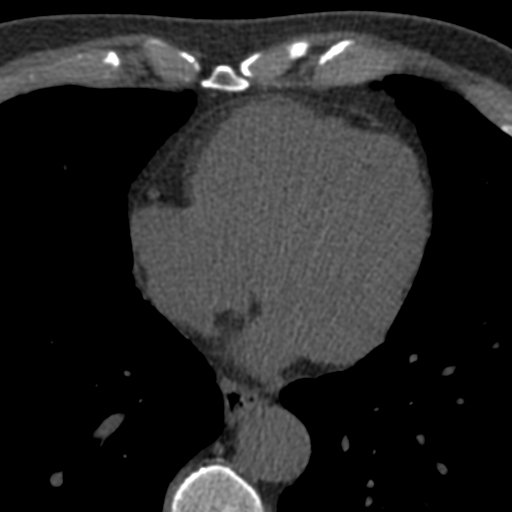
[im 42/70  vessel]
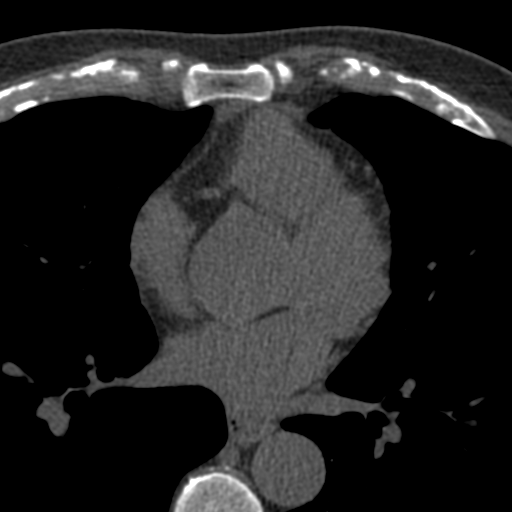
[im 56/70  vessel]
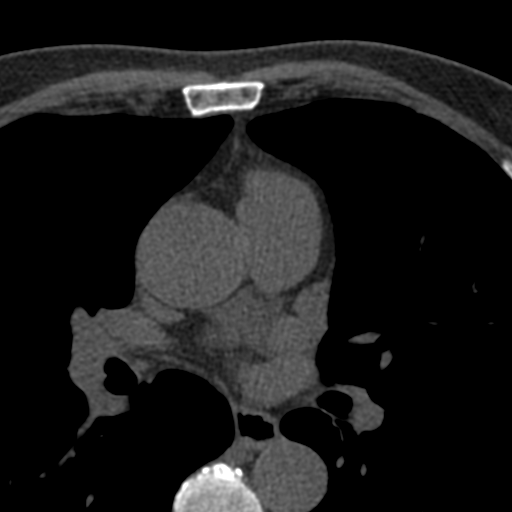

[Series 3: calcium scoring 2.00 br40 bestdiast 70% axial · axial · 0.64mm/px · z∈[+1899,+1991]mm · 5 of 70 slices shown, 7 images]
[im 12/70  vessel]
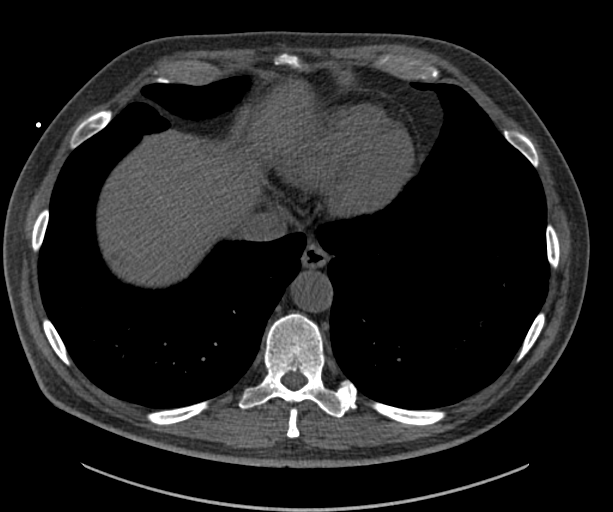
[im 12/70  lung]
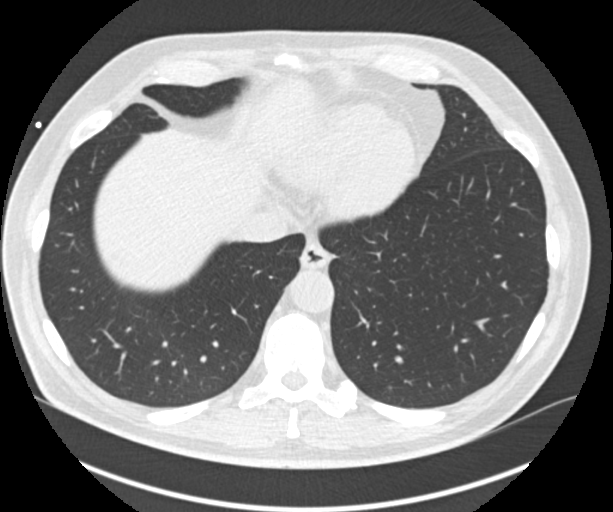
[im 24/70  vessel]
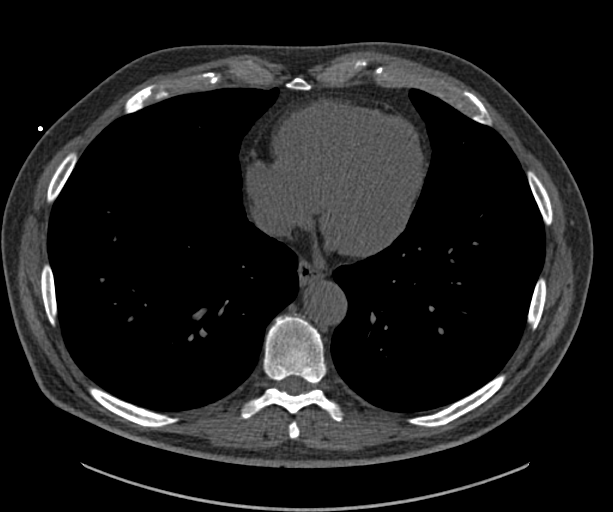
[im 35/70  vessel]
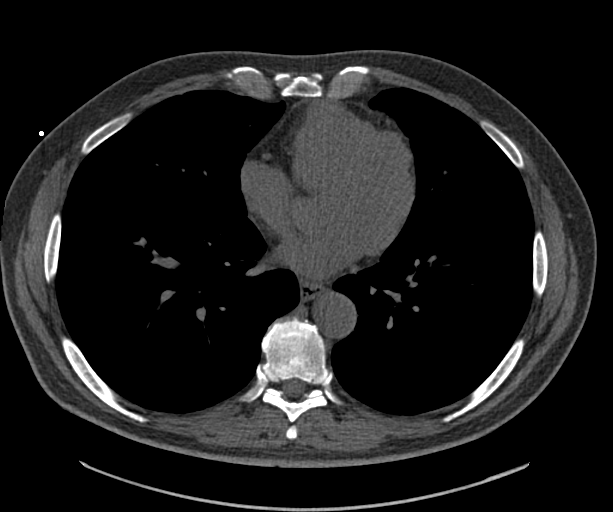
[im 47/70  vessel]
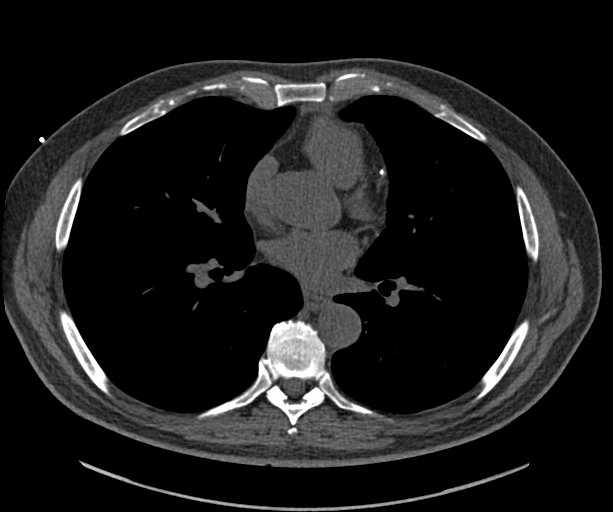
[im 58/70  vessel]
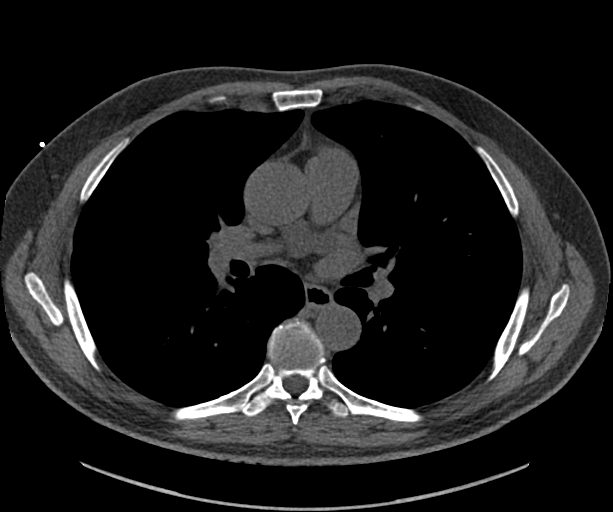
[im 58/70  lung]
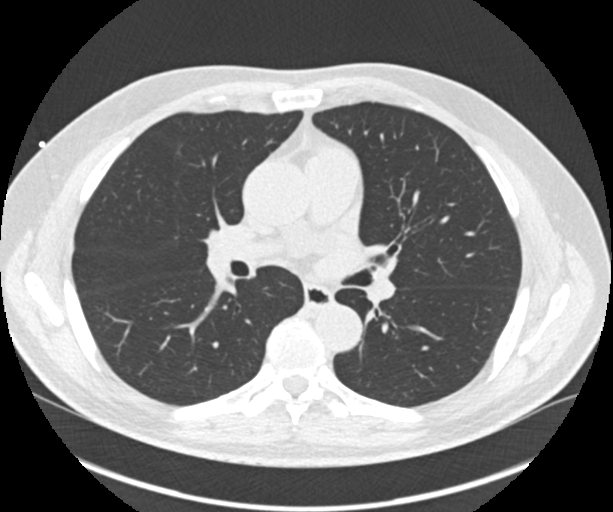

[Series 9: calcium scoring 2.00 br60 bestdiast 70% lungs · axial · 0.64mm/px · z∈[+1899,+1991]mm · 5 of 70 slices shown]
[im 12/70  vessel]
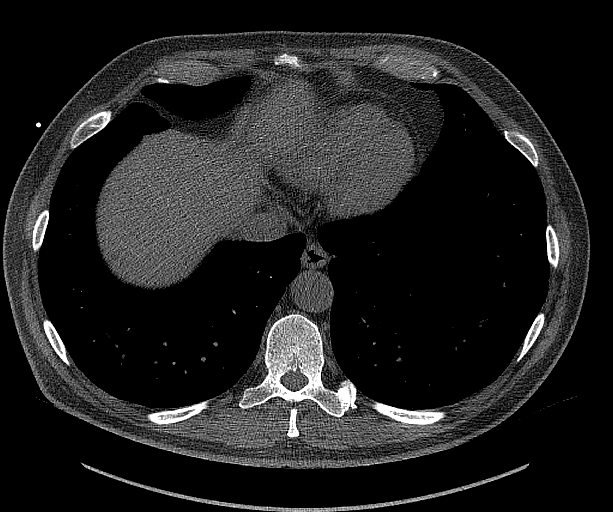
[im 24/70  vessel]
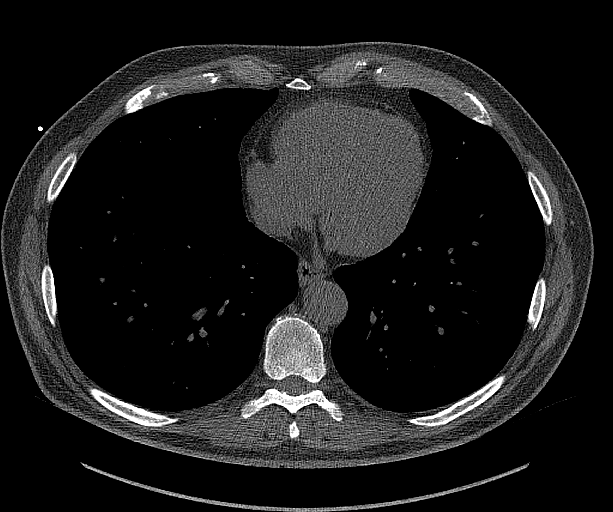
[im 35/70  vessel]
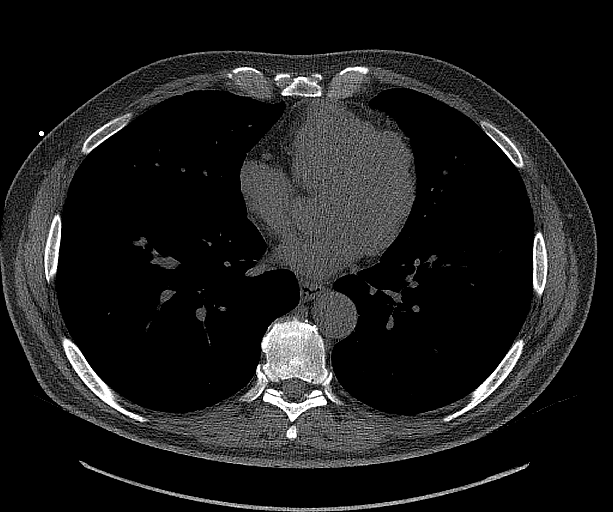
[im 47/70  vessel]
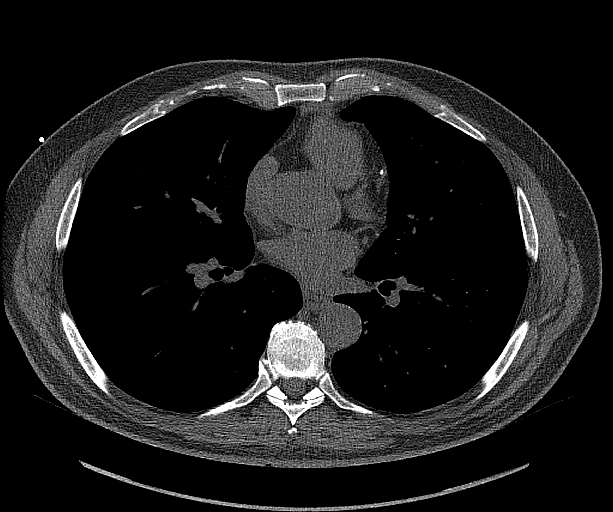
[im 58/70  vessel]
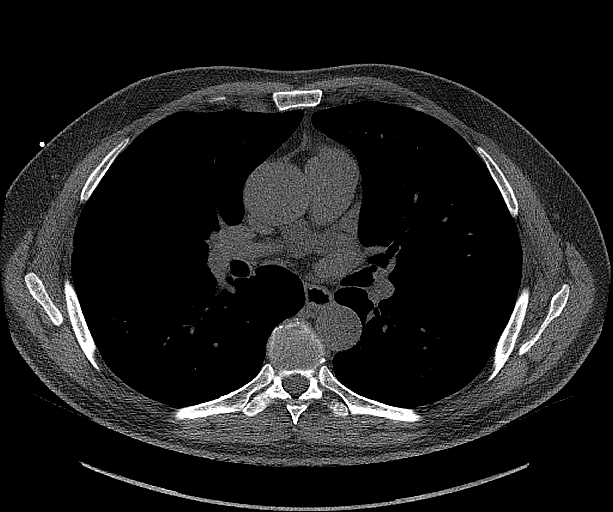

[14 of 20 positions shown; findings below may reference images not displayed]

FINDINGS: CORONARY CALCIUM SCORES:

Left Main: 10

LAD: 95

LCx: 0

RCA: 0

Total Agatston Score: 105

[HOSPITAL] percentile: 58

AORTA MEASUREMENTS:

Ascending Aorta: 41 mm

Descending Aorta: 27 mm

OTHER FINDINGS:

The heart size is within normal limits. No pericardial fluid is
identified. The visualized ascending thoracic aorta shows mild
dilatation measuring up to approximately 4.1 cm in greatest caliber.
Visualized central pulmonary arteries are normal in caliber.
Visualized mediastinum and hilar regions demonstrate no
lymphadenopathy or masses. Calcified right hilar lymph nodes are
consistent with prior granulomatous disease. Visualized lungs show
no evidence of pulmonary edema, consolidation, pneumothorax, nodule
or pleural fluid. Visualized upper abdomen and bony structures are
unremarkable.
IMPRESSION: 1. Coronary calcium score of 105 is at the 58th percentile for the
patient's age, sex and race.
2. Mild dilatation of the ascending thoracic aorta measuring up to
4.1 cm in maximum diameter. Recommend annual imaging followup by CTA
or MRA. This recommendation follows 8181
ACCF/AHA/AATS/ACR/ASA/SCA/EMMANUEL NANA YAW/DEDIC/STAMINA/SEYMOUR Guidelines for the
Diagnosis and Management of Patients with Thoracic Aortic Disease.
Circulation. 8181; 121: E266-e369. Aortic aneurysm NOS ([id number])
3. Evidence of prior granulomatous disease with calcified right
hilar lymph nodes.

## 2021-11-05 DIAGNOSIS — Z125 Encounter for screening for malignant neoplasm of prostate: Secondary | ICD-10-CM | POA: Diagnosis not present

## 2021-11-05 DIAGNOSIS — R7303 Prediabetes: Secondary | ICD-10-CM | POA: Diagnosis not present

## 2021-11-05 DIAGNOSIS — E785 Hyperlipidemia, unspecified: Secondary | ICD-10-CM | POA: Diagnosis not present

## 2021-11-05 DIAGNOSIS — Z0000 Encounter for general adult medical examination without abnormal findings: Secondary | ICD-10-CM | POA: Diagnosis not present

## 2021-11-12 DIAGNOSIS — E7800 Pure hypercholesterolemia, unspecified: Secondary | ICD-10-CM | POA: Diagnosis not present

## 2021-11-12 DIAGNOSIS — Z0000 Encounter for general adult medical examination without abnormal findings: Secondary | ICD-10-CM | POA: Diagnosis not present

## 2021-11-12 DIAGNOSIS — R7303 Prediabetes: Secondary | ICD-10-CM | POA: Diagnosis not present

## 2021-11-12 DIAGNOSIS — E038 Other specified hypothyroidism: Secondary | ICD-10-CM | POA: Diagnosis not present

## 2021-11-13 ENCOUNTER — Other Ambulatory Visit: Payer: Self-pay | Admitting: Registered Nurse

## 2021-11-13 DIAGNOSIS — I77810 Thoracic aortic ectasia: Secondary | ICD-10-CM

## 2021-12-09 ENCOUNTER — Inpatient Hospital Stay: Admission: RE | Admit: 2021-12-09 | Payer: Self-pay | Source: Ambulatory Visit

## 2021-12-26 DIAGNOSIS — H401131 Primary open-angle glaucoma, bilateral, mild stage: Secondary | ICD-10-CM | POA: Diagnosis not present

## 2021-12-27 ENCOUNTER — Other Ambulatory Visit: Payer: Self-pay

## 2022-01-21 ENCOUNTER — Ambulatory Visit
Admission: RE | Admit: 2022-01-21 | Discharge: 2022-01-21 | Disposition: A | Discharge status: Home / Self Care | Payer: BC Managed Care – PPO | Source: Ambulatory Visit | Attending: Registered Nurse | Admitting: Registered Nurse

## 2022-01-21 DIAGNOSIS — M79641 Pain in right hand: Secondary | ICD-10-CM | POA: Diagnosis not present

## 2022-01-21 DIAGNOSIS — M79642 Pain in left hand: Secondary | ICD-10-CM | POA: Diagnosis not present

## 2022-01-21 DIAGNOSIS — D71 Functional disorders of polymorphonuclear neutrophils: Secondary | ICD-10-CM | POA: Diagnosis not present

## 2022-01-21 DIAGNOSIS — I2510 Atherosclerotic heart disease of native coronary artery without angina pectoris: Secondary | ICD-10-CM | POA: Diagnosis not present

## 2022-01-21 DIAGNOSIS — D7389 Other diseases of spleen: Secondary | ICD-10-CM | POA: Diagnosis not present

## 2022-01-21 DIAGNOSIS — Z79899 Other long term (current) drug therapy: Secondary | ICD-10-CM | POA: Diagnosis not present

## 2022-01-21 DIAGNOSIS — M1990 Unspecified osteoarthritis, unspecified site: Secondary | ICD-10-CM | POA: Diagnosis not present

## 2022-01-21 DIAGNOSIS — L4050 Arthropathic psoriasis, unspecified: Secondary | ICD-10-CM | POA: Diagnosis not present

## 2022-01-21 DIAGNOSIS — I712 Thoracic aortic aneurysm, without rupture, unspecified: Secondary | ICD-10-CM | POA: Diagnosis not present

## 2022-01-21 DIAGNOSIS — I77810 Thoracic aortic ectasia: Secondary | ICD-10-CM

## 2022-01-21 DIAGNOSIS — M6281 Muscle weakness (generalized): Secondary | ICD-10-CM | POA: Diagnosis not present

## 2022-01-21 MED ORDER — IOPAMIDOL (ISOVUE-370) INJECTION 76%
75.0000 mL | Freq: Once | INTRAVENOUS | Status: AC | PRN
  Administered 2022-01-21: 75 mL via INTRAVENOUS

## 2022-02-20 DIAGNOSIS — S4981XA Other specified injuries of right shoulder and upper arm, initial encounter: Secondary | ICD-10-CM | POA: Diagnosis not present

## 2022-02-27 DIAGNOSIS — M25511 Pain in right shoulder: Secondary | ICD-10-CM | POA: Diagnosis not present

## 2022-03-25 ENCOUNTER — Other Ambulatory Visit: Payer: BC Managed Care – PPO

## 2022-04-03 ENCOUNTER — Ambulatory Visit: Payer: BC Managed Care – PPO | Admitting: Cardiology

## 2022-04-04 ENCOUNTER — Other Ambulatory Visit: Payer: Self-pay

## 2022-04-04 ENCOUNTER — Telehealth: Payer: Self-pay | Admitting: Cardiology

## 2022-04-04 DIAGNOSIS — I77810 Thoracic aortic ectasia: Secondary | ICD-10-CM

## 2022-04-04 NOTE — Telephone Encounter (Signed)
done

## 2022-04-04 NOTE — Telephone Encounter (Signed)
Patient needs order for upcoming echo on 04/08/2022.

## 2022-04-08 ENCOUNTER — Other Ambulatory Visit: Payer: BC Managed Care – PPO

## 2022-04-08 ENCOUNTER — Ambulatory Visit: Payer: BC Managed Care – PPO

## 2022-04-08 DIAGNOSIS — I77810 Thoracic aortic ectasia: Secondary | ICD-10-CM

## 2022-04-11 ENCOUNTER — Ambulatory Visit: Payer: BC Managed Care – PPO | Admitting: Cardiology

## 2022-04-11 NOTE — Progress Notes (Signed)
Called patient, Cameron Simmons, Cameron Simmons

## 2022-04-15 NOTE — Progress Notes (Signed)
Gave patient results, he acknowleged understanding and had no further questions.

## 2022-04-17 ENCOUNTER — Ambulatory Visit: Payer: BC Managed Care – PPO | Admitting: Internal Medicine

## 2022-04-17 ENCOUNTER — Encounter: Payer: Self-pay | Admitting: Internal Medicine

## 2022-04-17 VITALS — BP 134/71 | HR 72 | Ht 71.0 in | Wt 206.0 lb

## 2022-04-17 DIAGNOSIS — I77810 Thoracic aortic ectasia: Secondary | ICD-10-CM

## 2022-04-17 DIAGNOSIS — E782 Mixed hyperlipidemia: Secondary | ICD-10-CM

## 2022-04-17 NOTE — Progress Notes (Signed)
Date:  04/17/2022   ID:  Dorann Ou, DOB 1955-08-08, MRN [id number]  PCP:  Merrilee Seashore, MD  Cardiologist:  Rex Kras, DO, [facility] (established care 01/29/2021)  Date: 04/17/22 Last Office Visit: 01/29/2021  Chief Complaint  Patient presents with   Ascending aorta dilatation   Follow-up   Results    HPI  Cameron Simmons is a 67 y.o. Caucasian male who presents to the office with a chief complaint of "Work-up for coronary artery calcification, discuss test results." Patient's past medical history and cardiovascular risk factors include: Psoriatic arthritis, Crohn's disease, moderate coronary artery calcification, hyperlipidemia.  He is referred to the office at the request of Merrilee Seashore, MD for evaluation of Evaluation for coronary artery disease and dilated aorta.  Patient presents for follow-up visit.  He has been doing well since the last time he was here.  He is tolerating his medications without side effects.  No new concerns or complaints today.  Patient denies chest pain, shortness of breath, palpitations, diaphoresis, syncope, orthopnea, edema, PND, claudication.  FUNCTIONAL STATUS: No structured exercise program; however, patient's remains active with day-to-day activities, yard work, and leisure he goes out for a walk in his community.  ALLERGIES: No Known Allergies  MEDICATION LIST PRIOR TO VISIT: Current Meds  Medication Sig   acetaminophen (TYLENOL) 500 MG tablet Take 500 mg by mouth every 6 (six) hours as needed.   cyclobenzaprine (FLEXERIL) 10 MG tablet Take 10 mg by mouth 3 (three) times daily as needed.   HUMIRA PEN 40 MG/0.4ML PNKT SMARTSIG:40 Milligram(s) SUB-Q Once a Week   latanoprost (XALATAN) 0.005 % ophthalmic solution Place 1 drop into both eyes at bedtime.   lovastatin (MEVACOR) 20 MG tablet Take 20 mg by mouth at bedtime.   traZODone (DESYREL) 50 MG tablet Take 50 mg by mouth at bedtime as needed.     PAST MEDICAL  HISTORY: Past Medical History:  Diagnosis Date   Agatston coronary artery calcium score between 100 and 199    Arthritis    Crohn's disease ([place])    Hyperlipidemia     PAST SURGICAL HISTORY: Past Surgical History:  Procedure Laterality Date   COLONOSCOPY  12/29/2020   TONSILLECTOMY     when pt was 6    FAMILY HISTORY: The patient family history includes Aneurysm in his mother; Colon cancer in his mother; Heart attack in his father, maternal grandfather, maternal grandmother, maternal uncle, and paternal grandfather; Hyperlipidemia in his brother, father, maternal uncle, and paternal grandfather; Hypertension in his brother and father; Lung cancer in his mother; Prostate cancer in his father.  SOCIAL HISTORY:  The patient  reports that he quit smoking about 44 years ago. His smoking use included cigarettes. He has a 8.00 pack-year smoking history. He has never used smokeless tobacco. He reports current alcohol use. He reports that he does not use drugs.  REVIEW OF SYSTEMS: Review of Systems  Constitutional: Negative for chills and fever.  HENT:  Negative for hoarse voice and nosebleeds.   Eyes:  Negative for discharge, double vision and pain.  Cardiovascular:  Negative for chest pain, claudication, dyspnea on exertion, leg swelling, near-syncope, orthopnea, palpitations, paroxysmal nocturnal dyspnea and syncope.  Respiratory:  Negative for hemoptysis and shortness of breath.   Musculoskeletal:  Positive for arthritis and muscle cramps. Negative for myalgias.  Gastrointestinal:  Negative for abdominal pain, constipation, diarrhea, hematemesis, hematochezia, melena, nausea and vomiting.  Neurological:  Negative for dizziness and light-headedness.    PHYSICAL EXAM:  04/17/2022    8:44 AM 03/26/2021    1:32 PM 01/29/2021    8:46 AM  Vitals with BMI  Height 5\' 11"  5\' 11"  5\' 11"   Weight 206 lbs 218 lbs 213 lbs  BMI 28.74 78.29 56.21  Systolic 308 657 846  Diastolic 71 76 82   Pulse 72 70 76    CONSTITUTIONAL: Well-developed and well-nourished. No acute distress.  SKIN: Skin is warm and dry. No rash noted. No cyanosis. No pallor. No jaundice HEAD: Normocephalic and atraumatic.  EYES: No scleral icterus MOUTH/THROAT: Moist oral membranes.  NECK: No JVD present. No thyromegaly noted. No carotid bruits  LYMPHATIC: No visible cervical adenopathy.  CHEST Normal respiratory effort. No intercostal retractions  LUNGS: Clear to auscultation bilaterally.  No stridor. No wheezes. No rales.  CARDIOVASCULAR: Regular rate and rhythm, positive S1-S2, no murmurs rubs or gallops appreciated. ABDOMINAL: Obese, soft, nontender, nondistended, positive bowel sounds all 4 quadrants. No apparent ascites.  EXTREMITIES: No peripheral edema, warm to touch, Heberden's nodes.  HEMATOLOGIC: No significant bruising NEUROLOGIC: Oriented to person, place, and time. Nonfocal. Normal muscle tone.  PSYCHIATRIC: Normal mood and affect. Normal behavior. Cooperative  CARDIAC DATABASE: EKG: 01/29/2021: NSR, 70bpm, IRBBB, without underlying injury pattern.   04/17/2022: normal sinus rhythm with iRBBB. No change from prior  Echocardiogram: 02/13/2021: Normal LV systolic function with visual EF 60-65%. Left ventricle cavity is normal in size. Normal left ventricular wall thickness. Normal global wall motion. Normal diastolic filling pattern, normal LAP.  Mild (Grade I) mitral regurgitation. The aortic root is mildly dilated, 10mm. Proximal ascending aorta not well visualized. No prior study for comparison.   Stress Testing: Exercise Myoview stress test 02/13/2021: 1 Day Rest/Stress Protocol. Exercise time 8 minutes 20 seconds on Bruce protocol, achieved 10.16 METS, 90% of APMHR. Stress ECG negative for ischemia.  Normal myocardial perfusion without underlying ischemia or injury pattern. Normal left ventricular size, wall thickness preserved, without regional wall motion  abnormalities. Calculated LVEF 59%.  Heart Catheterization: None  Coronary artery calcium score: 12/14/2020: Left Main: 10   LAD: 95   LCx: 0   RCA: 0   Total Agatston Score: 105   MESA database percentile: 58   AORTA MEASUREMENTS:   Ascending Aorta: 41 mm   Descending Aorta: 27 mm 1. Coronary calcium score of 105 is at the 58th percentile for the patient's age, sex and race. 2. 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 Recommend annual imaging followup by CTA or MRA. This recommendation follows 2010 ACCF/AHA/AATS/ACR/ASA/SCA/SCAI/SIR/STS/SVM Guidelines for the Diagnosis and Management of Patients with Thoracic Aortic Disease. Circulation. 2010; 121: N629-B284. Aortic aneurysm NOS (ICD10-I71.9) 3. Evidence of prior granulomatous disease with calcified right hilar lymph nodes.  LABORATORY DATA:     No data to display              No data to display          Lipid Panel  No results found for: "CHOL", "TRIG", "HDL", "CHOLHDL", "VLDL", "LDLCALC", "LDLDIRECT", "LABVLDL"  No components found for: "NTPROBNP" No results for input(s): "PROBNP" in the last 8760 hours. No results for input(s): "TSH" in the last 8760 hours.  BMP No results for input(s): "NA", "K", "CL", "CO2", "GLUCOSE", "BUN", "CREATININE", "CALCIUM", "GFRNONAA", "GFRAA" in the last 8760 hours.  HEMOGLOBIN A1C No results found for: "HGBA1C", "MPG"  IMPRESSION:    ICD-10-CM   1. Ascending aorta dilatation (HCC)  I77.810 EKG 12-Lead    PCV  ECHOCARDIOGRAM COMPLETE    2. Mixed hyperlipidemia  E78.2        RECOMMENDATIONS: Cameron Simmons is a 67 y.o. Caucasian male whose past medical history and cardiac risk factors include: Psoriatic arthritis, Crohn's disease, moderate coronary artery calcification, hyperlipidemia.    Mixed hyperlipidemia Currently on statin therapy. Currently managed by primary care provider. Follow-up in 1 year or sooner  if needed   Ascending aorta dilatation (HCC) Incidentally noted to have ascending aorta measuring 41 mm on his recent coronary calcium study. Echo notes the root to be approximately 40 mm.  Proximal ascending aorta not well visualized. We will repeat echocardiogram in 1 year to reevaluate LVEF and aortic dimensions      FINAL MEDICATION LIST END OF ENCOUNTER: No orders of the defined types were placed in this encounter.    Medications Discontinued During This Encounter  Medication Reason   aspirin EC 81 MG tablet Patient Preference   meloxicam (MOBIC) 15 MG tablet Patient Preference     Current Outpatient Medications:    acetaminophen (TYLENOL) 500 MG tablet, Take 500 mg by mouth every 6 (six) hours as needed., Disp: , Rfl:    cyclobenzaprine (FLEXERIL) 10 MG tablet, Take 10 mg by mouth 3 (three) times daily as needed., Disp: , Rfl:    HUMIRA PEN 40 MG/0.4ML PNKT, SMARTSIG:40 Milligram(s) SUB-Q Once a Week, Disp: , Rfl:    latanoprost (XALATAN) 0.005 % ophthalmic solution, Place 1 drop into both eyes at bedtime., Disp: , Rfl:    lovastatin (MEVACOR) 20 MG tablet, Take 20 mg by mouth at bedtime., Disp: , Rfl:    traZODone (DESYREL) 50 MG tablet, Take 50 mg by mouth at bedtime as needed., Disp: , Rfl:   Orders Placed This Encounter  Procedures   EKG 12-Lead   PCV ECHOCARDIOGRAM COMPLETE    There are no Patient Instructions on file for this visit.     Floydene Flock, DO, [facility]  Pager: [phone] Office: [phone]

## 2023-04-13 ENCOUNTER — Other Ambulatory Visit: Payer: BC Managed Care – PPO

## 2023-04-13 ENCOUNTER — Ambulatory Visit (HOSPITAL_COMMUNITY): Payer: Self-pay

## 2023-04-27 ENCOUNTER — Ambulatory Visit: Payer: Self-pay | Admitting: Cardiology
